# Patient Record
Sex: Male | Born: 1975 | Race: White | Hispanic: No | Smoking: Current every day smoker
Health system: Southern US, Community
[De-identification: ages and names within clinical notes are randomized; demographics above are authoritative.]

## PROBLEM LIST (undated history)

## (undated) DIAGNOSIS — I10 Essential (primary) hypertension: Secondary | ICD-10-CM

## (undated) DIAGNOSIS — E079 Disorder of thyroid, unspecified: Secondary | ICD-10-CM

---

## 2002-08-30 HISTORY — PX: CHOLECYSTECTOMY: SHX55

## 2012-08-30 HISTORY — PX: SHOULDER ARTHROSCOPY WITH ROTATOR CUFF REPAIR: SHX5685

## 2013-04-11 ENCOUNTER — Emergency Department: Payer: Self-pay | Admitting: Emergency Medicine

## 2013-04-19 ENCOUNTER — Encounter (HOSPITAL_COMMUNITY): Payer: Self-pay | Admitting: Emergency Medicine

## 2013-04-19 ENCOUNTER — Emergency Department (HOSPITAL_COMMUNITY): Payer: Self-pay

## 2013-04-19 ENCOUNTER — Emergency Department (HOSPITAL_COMMUNITY)
Admission: EM | Admit: 2013-04-19 | Discharge: 2013-04-20 | Disposition: A | Discharge status: Home / Self Care | Payer: Self-pay | Attending: Emergency Medicine | Admitting: Emergency Medicine

## 2013-04-19 DIAGNOSIS — Y9289 Other specified places as the place of occurrence of the external cause: Secondary | ICD-10-CM | POA: Insufficient documentation

## 2013-04-19 DIAGNOSIS — S43401A Unspecified sprain of right shoulder joint, initial encounter: Secondary | ICD-10-CM

## 2013-04-19 DIAGNOSIS — IMO0002 Reserved for concepts with insufficient information to code with codable children: Secondary | ICD-10-CM | POA: Insufficient documentation

## 2013-04-19 DIAGNOSIS — Y9301 Activity, walking, marching and hiking: Secondary | ICD-10-CM | POA: Insufficient documentation

## 2013-04-19 DIAGNOSIS — W010XXA Fall on same level from slipping, tripping and stumbling without subsequent striking against object, initial encounter: Secondary | ICD-10-CM | POA: Insufficient documentation

## 2013-04-19 DIAGNOSIS — I10 Essential (primary) hypertension: Secondary | ICD-10-CM | POA: Insufficient documentation

## 2013-04-19 HISTORY — DX: Disorder of thyroid, unspecified: E07.9

## 2013-04-19 HISTORY — DX: Essential (primary) hypertension: I10

## 2013-04-19 MED ORDER — LISINOPRIL-HYDROCHLOROTHIAZIDE 10-12.5 MG PO TABS: 1.0000 | ORAL_TABLET | Freq: Every day | ORAL | Status: DC

## 2013-04-19 MED ORDER — TRAMADOL HCL 50 MG PO TABS: 50.0000 mg | ORAL_TABLET | Freq: Four times a day (QID) | ORAL | Status: DC | PRN

## 2013-04-19 NOTE — ED Notes (Signed)
Patient said he slipped while walking.  He said the wood ramp was slippery and he fell on his right shoulder.  Patient said he was seen at [facility] and they did x-rays however they never said what the results of the x-rays because the radiologist was not available to read the x-rays.  They discharged him with pain meds and told him it might just be a sprain.  They told him if it did not get better to come back to the ED in [place] and be seen.  The patient said his pain has gotten worse, his elbow hurts, and his right hand gets swollen.

## 2013-04-19 NOTE — ED Notes (Signed)
Family at bedside. 

## 2013-04-19 NOTE — ED Provider Notes (Signed)
CSN: [id number]     Arrival date & time 04/19/13  1213 History     First MD Initiated Contact with Patient 04/19/13 1216     Chief Complaint  Patient presents with  . Shoulder Pain   (Consider location/radiation/quality/duration/timing/severity/associated sxs/prior Treatment) HPI Patient is a 37 year old male who presents to emergency department complaining of right shoulder pain and injury. Patient states he was walking on a wet ground, states he slipped and landed onto the right shoulder. Patient did not sustain any other injuries at that time. Patient states that he went to [facility] and he had an x-ray taken of his shoulder but it was never told what the results were. Patient was discharged with Naprosyn. Patient states he has not followed up because he does not have any insurance and does not have a primary care doctor. Patient states he took his Naprosyn and Norco which he was prescribed. Patient states that his arm is not feeling any better. Denies any numbness or weakness in his hand. Patient has never injured that arm in the past. Patient states pain is worsened with movement of the shoulder. Nothing makes the pain better. Patient states he feels clicking and popping inside his right shoulder joint. No past medical history on file. No past surgical history on file. No family history on file. History  Substance Use Topics  . Smoking status: Not on file  . Smokeless tobacco: Not on file  . Alcohol Use: Not on file    Review of Systems  Constitutional: Negative for fever and chills.  HENT: Negative for neck pain and neck stiffness.   Musculoskeletal: Positive for myalgias, joint swelling and arthralgias.  Skin: Negative for rash.  Allergic/Immunologic: Negative for immunocompromised state.  Neurological: Negative for dizziness, weakness, light-headedness, numbness and headaches.  All other systems reviewed and are negative.    Allergies  Review of patient's  allergies indicates not on file.  Home Medications  No current outpatient prescriptions on file. BP 179/113  Pulse 83  Temp(Src) 97.7 F (36.5 C) (Oral)  Resp 18  SpO2 97% Physical Exam  Nursing note and vitals reviewed. Constitutional: He appears well-developed and well-nourished. No distress.  HENT:  Head: Normocephalic and atraumatic.  Eyes: Conjunctivae are normal.  Neck: Neck supple.  Cardiovascular: Normal rate, regular rhythm and normal heart sounds.   Distal radial pulses intact bilaterally.  Pulmonary/Chest: Effort normal. No respiratory distress. He has no wheezes. He has no rales.  Abdominal: Soft. Bowel sounds are normal. He exhibits no distension. There is no tenderness. There is no rebound.  Musculoskeletal: He exhibits no edema.  Bruising noted to the right upper arm over his biceps muscle. Bicep appears normal compared to the left. Patient has 5 out of 5 and equal biceps, triceps, deltoid strength bilaterally. Patient does not have any joint tenderness over the right shoulder. Patient has full passive range of motion of the right shoulder. Patient unable to lift right arm above 90 actively. Patient has pain with internal rotation, normal external rotation of the joint. Negative arm drop test.  Neurological: He is alert.  Skin: Skin is warm and dry.    ED Course   Procedures (including critical care time)  Labs Reviewed - No data to display No results found.  1. Shoulder sprain, right, initial encounter   2. Hypertension     MDM  Patient with right shoulder injury one week ago. He does not have a primary care doctor or an orthopedics doctor in insurance.  X-ray today is normal. It is possible that patient has an injury to his rotator cuff versus labrum. He does have a sling. Encouraged to ice, NSAIDs for pain and inflammation, Ultram for severe pain. He is given resources for primary care doctor here in this area. He is encouraged to follow up. As far as his  blood pressure have prescribed him lisinopril-hydrochlorothiazide for his blood pressure with 2 refills until he is able to get in with a primary care doctor for recheck. Patient is a asymptomatic for his high blood pressure.  Filed Vitals:   04/19/13 1220 04/19/13 1230 04/19/13 1245  BP: 179/113 186/115 167/112  Pulse: 83 82 77  Temp: 97.7 F (36.5 C)    TempSrc: Oral    Resp: 18    SpO2: 97% 98% 96%     Lottie Mussel, PA-C 04/20/13 [phone]

## 2013-04-19 NOTE — ED Notes (Signed)
Patient transported to X-ray 

## 2013-04-20 NOTE — ED Provider Notes (Signed)
Medical screening examination/treatment/procedure(s) were performed by non-physician practitioner and as supervising physician I was immediately available for consultation/collaboration.   Kathren Scearce, MD 04/20/13 1017 

## 2013-04-25 ENCOUNTER — Encounter: Payer: Self-pay | Admitting: Internal Medicine

## 2013-04-25 ENCOUNTER — Ambulatory Visit: Payer: Self-pay | Attending: Internal Medicine | Admitting: Internal Medicine

## 2013-04-25 VITALS — BP 169/135 | HR 86 | Temp 98.6°F | Resp 16 | Ht 73.0 in | Wt 217.0 lb

## 2013-04-25 DIAGNOSIS — S46001S Unspecified injury of muscle(s) and tendon(s) of the rotator cuff of right shoulder, sequela: Secondary | ICD-10-CM

## 2013-04-25 DIAGNOSIS — M25519 Pain in unspecified shoulder: Secondary | ICD-10-CM | POA: Insufficient documentation

## 2013-04-25 DIAGNOSIS — S46009A Unspecified injury of muscle(s) and tendon(s) of the rotator cuff of unspecified shoulder, initial encounter: Secondary | ICD-10-CM | POA: Insufficient documentation

## 2013-04-25 DIAGNOSIS — IMO0001 Reserved for inherently not codable concepts without codable children: Secondary | ICD-10-CM

## 2013-04-25 MED ORDER — OXYCODONE-ACETAMINOPHEN 5-325 MG PO TABS: 1.0000 | ORAL_TABLET | ORAL | Status: DC | PRN

## 2013-04-25 NOTE — Progress Notes (Signed)
Patient ID: Donald Middleton, male   DOB: 1976-06-01, 37 y.o.   MRN: [id number]   CC: Pain in the right shoulder  HPI: Patient is 37 year old male who presents to clinic with main concern of progressively worsening right shoulder pain, intermittent in nature and throbbing, 5/10 in severity when present but gets up to 10 out of 10 in severity on certain occasions. Patient explains he fell on that side 2 weeks prior to this visit and has not been getting significantly better. He denies any specific alleviating factors, pain is worse with movement. Patient denies similar events in the past. Patient denies numbness and tingling, no fevers and chills, no other systemic symptoms.  No Known Allergies Past Medical History  Diagnosis Date  . Hypertension   . Thyroid disease    Current Outpatient Prescriptions on File Prior to Visit  Medication Sig Dispense Refill  . traMADol (ULTRAM) 50 MG tablet Take 1 tablet (50 mg total) by mouth every 6 (six) hours as needed for pain.  15 tablet  0  . lisinopril-hydrochlorothiazide (PRINZIDE,ZESTORETIC) 10-12.5 MG per tablet Take 1 tablet by mouth daily.  30 tablet  2   No current facility-administered medications on file prior to visit.   Family History  Problem Relation Age of Onset  . Thyroid disease Mother   . Heart disease Father   . Stroke Father    History   Social History  . Marital Status: Single    Spouse Name: N/A    Number of Children: N/A  . Years of Education: N/A   Occupational History  . Not on file.   Social History Main Topics  . Smoking status: Current Every Day Smoker -- 1.00 packs/day    Types: Cigarettes  . Smokeless tobacco: Never Used  . Alcohol Use: Yes     Comment: 6 pack a day  . Drug Use: Yes    Special: Cocaine  . Sexual Activity: Not on file   Other Topics Concern  . Not on file   Social History Narrative  . No narrative on file    Review of Systems  Constitutional: Negative for fever, chills, diaphoresis,  activity change, appetite change and fatigue.  HENT: Negative for ear pain, nosebleeds, congestion, facial swelling, rhinorrhea, neck pain, neck stiffness and ear discharge.   Eyes: Negative for pain, discharge, redness, itching and visual disturbance.  Respiratory: Negative for cough, choking, chest tightness, shortness of breath, wheezing and stridor.   Cardiovascular: Negative for chest pain, palpitations and leg swelling.  Gastrointestinal: Negative for abdominal distention.  Genitourinary: Negative for dysuria, urgency, frequency, hematuria, flank pain, decreased urine volume, difficulty urinating and dyspareunia.  Musculoskeletal: Negative for back pain, and gait problem.  Neurological: Negative for dizziness, tremors, seizures, syncope, facial asymmetry, speech difficulty, weakness, light-headedness, numbness and headaches.  Hematological: Negative for adenopathy. Does not bruise/bleed easily.  Psychiatric/Behavioral: Negative for hallucinations, behavioral problems, confusion, dysphoric mood, decreased concentration and agitation.    Objective:   Filed Vitals:   04/25/13 0944  BP: 169/135  Pulse: 86  Temp: 98.6 F (37 C)  Resp: 16    Physical Exam  Constitutional: Appears well-developed and well-nourished. No distress.  HENT: Normocephalic. External right and left ear normal. Oropharynx is clear and moist.  Eyes: Conjunctivae and EOM are normal. PERRLA, no scleral icterus.  Neck: Normal ROM. Neck supple. No JVD. No tracheal deviation. No thyromegaly.  CVS: RRR, S1/S2 +, no murmurs, no gallops, no carotid bruit.  Pulmonary: Effort and breath sounds normal, no  stridor, rhonchi, wheezes, rales.  Abdominal: Soft. BS +,  no distension, tenderness, rebound or guarding.  Musculoskeletal: Limited range of motion in the right shoulder. Patient unable to extend and flex right shoulder, patient also having difficulty with abduction and adduction of the shoulder  Lymphadenopathy: No  lymphadenopathy noted, cervical, inguinal. Neuro: Alert. Normal reflexes, muscle tone coordination. No cranial nerve deficit. Skin: Skin is warm and dry. No rash noted. Not diaphoretic. No erythema. No pallor.  Psychiatric: Normal mood and affect. Behavior, judgment, thought content normal.   No results found for this basename: WBC, HGB, HCT, MCV, PLT   No results found for this basename: CREATININE, BUN, NA, K, CL, CO2    No results found for this basename: HGBA1C   Lipid Panel  No results found for this basename: chol, trig, hdl, cholhdl, vldl, ldlcalc       Assessment and plan:   Rotator cuff injury - Will prescribe analgesia for symptom control, also advised continuation of physical therapy - I have advised placing warm or cold compresses to the area which ever alleviate symptoms better - I have advised patient to come back in 2-3 weeks if his symptoms do not get better or if he gets worse

## 2013-04-25 NOTE — Patient Instructions (Signed)
Rotator Cuff Injury  The rotator cuff is the collective set of muscles and tendons that make up the stabilizing unit of your shoulder. This unit holds in the ball of the humerus (upper arm bone) in the socket of the scapula (shoulder blade). Injuries to this stabilizing unit most commonly come from sports or activities that cause the arm to be moved repeatedly over the head. Examples of this include throwing, weight lifting, swimming, racquet sports, or an injury such as falling on your arm. Chronic (longstanding) irritation of this unit can cause inflammation (soreness), bursitis, and eventual damage to the tendons to the point of rupture (tear). An acute (sudden) injury of the rotator cuff can result in a partial or complete tear. You may need surgery with complete tears. Small or partial rotator cuff tears may be treated conservatively with temporary immobilization, exercises and rest. Physical therapy may be needed.  HOME CARE INSTRUCTIONS   · Apply ice to the injury for 15-20 minutes 3-4 times per day for the first 2 days. Put the ice in a plastic bag and place a towel between the bag of ice and your skin.  · If you have a shoulder immobilizer (sling and straps), do not remove it for as long as directed by your caregiver or until you see a caregiver for a follow-up examination. If you need to remove it, move your arm as little as possible.  · You may want to sleep on several pillows or in a recliner at night to lessen swelling and pain.  · Only take over-the-counter or prescription medicines for pain, discomfort, or fever as directed by your caregiver.  · Do simple hand squeezing exercises with a soft rubber ball to decrease hand swelling.  SEEK MEDICAL CARE IF:   · Pain in your shoulder increases or new pain or numbness develops in your arm, hand, or fingers.  · Your hand or fingers are colder than your other hand.  SEEK IMMEDIATE MEDICAL CARE IF:   · Your arm, hand, or fingers are numb or tingling.  · Your  arm, hand, or fingers are increasingly swollen and painful, or turn white or blue.  Document Released: 08/13/2000 Document Revised: 11/08/2011 Document Reviewed: 08/06/2008  [employer]® Patient Information ©2014 [employer].

## 2013-04-25 NOTE — Progress Notes (Signed)
PT HERE TO ESTABLISH CARE HTN,THYROID DISEASE. NOT TAKING MEDS AT THIS TIME C/O R SHOULDER PAIN S/P FALL 2 WEEKS AGO. TRAMADOL NOT EFFECTIVE.NEG XRAYS BP 169/135- STATES UNABLE TO GET MEDICINE UNTIL Friday. DENIES H/A OR BLURRY VISION SMOKER

## 2013-05-03 ENCOUNTER — Ambulatory Visit: Payer: Self-pay | Attending: Internal Medicine | Admitting: Internal Medicine

## 2013-05-03 ENCOUNTER — Encounter: Payer: Self-pay | Admitting: Internal Medicine

## 2013-05-03 VITALS — BP 129/91 | HR 91 | Temp 98.0°F | Resp 16 | Ht 72.84 in | Wt 219.0 lb

## 2013-05-03 DIAGNOSIS — S46001S Unspecified injury of muscle(s) and tendon(s) of the rotator cuff of right shoulder, sequela: Secondary | ICD-10-CM

## 2013-05-03 MED ORDER — NAPROXEN 500 MG PO TABS: 500.0000 mg | ORAL_TABLET | Freq: Two times a day (BID) | ORAL | Status: DC

## 2013-05-03 MED ORDER — TRAMADOL HCL 50 MG PO TABS: 50.0000 mg | ORAL_TABLET | Freq: Four times a day (QID) | ORAL | Status: DC | PRN

## 2013-05-03 MED ORDER — LISINOPRIL-HYDROCHLOROTHIAZIDE 10-12.5 MG PO TABS: 1.0000 | ORAL_TABLET | Freq: Every day | ORAL | Status: DC

## 2013-05-03 NOTE — Progress Notes (Unsigned)
Pt is here for a follow up visit Pt was treated last visit for a right shoulder injury. Today his shoulder is better but he feels he is not able to go back to work yet.

## 2013-05-03 NOTE — Progress Notes (Unsigned)
Patient ID: Donald Middleton, male   DOB: 12-08-1975, 37 y.o.   MRN: [id number] Patient Demographics  Donald Middleton, is a 37 y.o. male  WUJ:[id number]  NFA:[id number]  DOB - 09/23/1975  Chief Complaint  Patient presents with  . Follow-up        Subjective:   Donald Middleton today is here for a follow up visit. Patient has No headache, No chest pain, No abdominal pain - No Nausea, No new weakness tingling or numbness, No Cough - SOB.  Continues to have right shoulder pain, unable to abduct or lift up. Works in [employer] loading trucks. Had a fall 2 weeks ago when he fell from the wheelchair ramp.  Objective:    Filed Vitals:   05/03/13 1706  BP: 129/91  Pulse: 91  Temp: 98 F (36.7 C)  TempSrc: Oral  Resp: 16  Height: 6' 0.83" (1.85 m)  Weight: 219 lb (99.338 kg)  SpO2: 98%     ALLERGIES:  No Known Allergies  PAST MEDICAL HISTORY: Past Medical History  Diagnosis Date  . Hypertension   . Thyroid disease     MEDICATIONS AT HOME: Prior to Admission medications   Medication Sig Start Date End Date Taking? Authorizing Provider  lisinopril-hydrochlorothiazide (PRINZIDE,ZESTORETIC) 10-12.5 MG per tablet Take 1 tablet by mouth daily. 05/03/13  Yes Ripudeep Jenna Luo, MD  oxyCODONE-acetaminophen (PERCOCET/ROXICET) 5-325 MG per tablet Take 1 tablet by mouth every 4 (four) hours as needed for pain. 04/25/13  Yes Dorothea Ogle, MD  naproxen (NAPROSYN) 500 MG tablet Take 1 tablet (500 mg total) by mouth 2 (two) times daily with a meal. 05/03/13   Ripudeep Jenna Luo, MD  traMADol (ULTRAM) 50 MG tablet Take 1 tablet (50 mg total) by mouth every 6 (six) hours as needed for pain. 05/03/13   Ripudeep Jenna Luo, MD     Exam  General appearance :Awake, alert, NAD, Speech Clear.  HEENT: Atraumatic and Normocephalic, PERLA Neck: supple, no JVD. No cervical lymphadenopathy.  Chest: Clear to auscultation bilaterally, no wheezing, rales or rhonchi CVS: S1 S2 regular, no murmurs.  Abdomen: soft, NBS,  NT, ND, no gaurding, rigidity or rebound. Extremities: no cyanosis or clubbing, B/L Lower Ext shows no edema Unable to abduct or lift up the arm above the shoulder level- right Neurology: Awake alert, and oriented X 3, CN II-XII intact, Non focal Skin: No Rash or lesions Wounds:N/A    Data Review   Basic Metabolic Panel: No results found for this basename: NA, K, CL, CO2, GLUCOSE, BUN, CREATININE, CALCIUM, MG, PHOS,  in the last 168 hours Liver Function Tests: No results found for this basename: AST, ALT, ALKPHOS, BILITOT, PROT, ALBUMIN,  in the last 168 hours  CBC: No results found for this basename: WBC, NEUTROABS, HGB, HCT, MCV, PLT,  in the last 168 hours  ------------------------------------------------------------------------------------------------------------------ No results found for this basename: HGBA1C,  in the last 72 hours ------------------------------------------------------------------------------------------------------------------ No results found for this basename: CHOL, HDL, LDLCALC, TRIG, CHOLHDL, LDLDIRECT,  in the last 72 hours ------------------------------------------------------------------------------------------------------------------ No results found for this basename: TSH, T4TOTAL, FREET3, T3FREE, THYROIDAB,  in the last 72 hours ------------------------------------------------------------------------------------------------------------------ No results found for this basename: VITAMINB12, FOLATE, FERRITIN, TIBC, IRON, RETICCTPCT,  in the last 72 hours  Coagulation profile  No results found for this basename: INR, PROTIME,  in the last 168 hours    Assessment & Plan   Active Problems: Right shoulder pain, suspicion for rotator cuff injury - Will do urgent MRI of the right  shoulder to assess for any partial tear or complete tear - Orthopedics referral sent to sports medicine - Refill tramadol, added Naprosyn  Hypertension: Refilled lisinopril  HCTZ  Follow-up in one week     RAI,RIPUDEEP M.D. 05/03/2013, 5:36 PM

## 2013-05-07 ENCOUNTER — Other Ambulatory Visit: Payer: Self-pay | Admitting: Internal Medicine

## 2013-05-07 ENCOUNTER — Ambulatory Visit: Payer: Self-pay | Attending: Family Medicine

## 2013-05-08 ENCOUNTER — Ambulatory Visit (HOSPITAL_COMMUNITY)
Admission: RE | Admit: 2013-05-08 | Discharge: 2013-05-08 | Disposition: A | Discharge status: Home / Self Care | Payer: Self-pay | Source: Ambulatory Visit | Attending: Internal Medicine | Admitting: Internal Medicine

## 2013-05-08 DIAGNOSIS — W19XXXA Unspecified fall, initial encounter: Secondary | ICD-10-CM | POA: Insufficient documentation

## 2013-05-08 DIAGNOSIS — S46001S Unspecified injury of muscle(s) and tendon(s) of the rotator cuff of right shoulder, sequela: Secondary | ICD-10-CM

## 2013-05-08 DIAGNOSIS — S46819A Strain of other muscles, fascia and tendons at shoulder and upper arm level, unspecified arm, initial encounter: Secondary | ICD-10-CM | POA: Insufficient documentation

## 2013-05-09 ENCOUNTER — Ambulatory Visit (INDEPENDENT_AMBULATORY_CARE_PROVIDER_SITE_OTHER): Payer: Self-pay | Admitting: Family Medicine

## 2013-05-09 ENCOUNTER — Encounter: Payer: Self-pay | Admitting: Family Medicine

## 2013-05-09 VITALS — BP 144/99 | HR 82 | Ht 73.0 in | Wt 220.0 lb

## 2013-05-09 DIAGNOSIS — S4980XA Other specified injuries of shoulder and upper arm, unspecified arm, initial encounter: Secondary | ICD-10-CM

## 2013-05-09 DIAGNOSIS — S43429A Sprain of unspecified rotator cuff capsule, initial encounter: Secondary | ICD-10-CM

## 2013-05-09 DIAGNOSIS — S46001A Unspecified injury of muscle(s) and tendon(s) of the rotator cuff of right shoulder, initial encounter: Secondary | ICD-10-CM

## 2013-05-09 DIAGNOSIS — M75101 Unspecified rotator cuff tear or rupture of right shoulder, not specified as traumatic: Secondary | ICD-10-CM

## 2013-05-09 NOTE — Patient Instructions (Addendum)
You have a partial thickness rotator cuff tear of subscapularis and supraspinatus. Start physical therapy and do home exercises as directed. Out of work unless something available where you don't have to use the right arm. Icing 15 minutes at a time 3-4 times a day as needed. Naproxen 500mg  twice a day with food for pain and inflammation. Tramadol as needed for severe pain (no driving on this). At the first of the month we will put in for an orthopedic referral. Follow up with me in 1 month.

## 2013-05-10 ENCOUNTER — Encounter: Payer: Self-pay | Admitting: Family Medicine

## 2013-05-10 NOTE — Progress Notes (Signed)
Patient ID: Donald Middleton, male   DOB: Mar 03, 1976, 37 y.o.   MRN: [id number]  PCP: Jeanann Lewandowsky, MD  Subjective:   HPI: Patient is a 37 y.o. male here for right shoulder injury.  Patient reports on 8/13 he was on wheelchair ramp at his house when he slipped on wet plank of wood and fell onto right shoulder. Is right handed. Taking tramadol, naproxen. Very limited motion. Followed up with PCP after a week of rest and not improving - had MRI showing high grade partial thickness tear of subscapularis, supraspinatus along with biceps tendon dislocation. Right handed.  Past Medical History  Diagnosis Date  . Hypertension   . Thyroid disease     Current Outpatient Prescriptions on File Prior to Visit  Medication Sig Dispense Refill  . lisinopril-hydrochlorothiazide (PRINZIDE,ZESTORETIC) 10-12.5 MG per tablet Take 1 tablet by mouth daily.  30 tablet  3  . traMADol (ULTRAM) 50 MG tablet Take 1 tablet (50 mg total) by mouth every 6 (six) hours as needed for pain.  60 tablet  0  . naproxen (NAPROSYN) 500 MG tablet Take 1 tablet (500 mg total) by mouth 2 (two) times daily with a meal.  60 tablet  1   No current facility-administered medications on file prior to visit.    Past Surgical History  Procedure Laterality Date  . Cholecystectomy  2004    No Known Allergies  History   Social History  . Marital Status: Single    Spouse Name: N/A    Number of Children: N/A  . Years of Education: N/A   Occupational History  . Not on file.   Social History Main Topics  . Smoking status: Current Every Day Smoker -- 1.00 packs/day    Types: Cigarettes  . Smokeless tobacco: Never Used  . Alcohol Use: Yes     Comment: 6 pack a day  . Drug Use: Yes    Special: Cocaine  . Sexual Activity: Not on file   Other Topics Concern  . Not on file   Social History Narrative  . No narrative on file    Family History  Problem Relation Age of Onset  . Thyroid disease Mother   . Heart  disease Father   . Stroke Father   . Heart attack Father   . Hypertension Father   . Diabetes Neg Hx   . Hyperlipidemia Neg Hx     BP 144/99  Pulse 82  Ht 6\' 1"  (1.854 m)  Wt 220 lb (99.791 kg)  BMI 29.03 kg/m2  Review of Systems: See HPI above.    Objective:  Physical Exam:  Gen: NAD  R shoulder: No swelling, ecchymoses.  No gross deformity. Mild TTP biceps tendon.  No other TTP. Full IR.  ER to 30 degrees, flexion 150 degrees, abduction 90 degrees. Biceps tendon subluxation noted with ER and abduction, passive motion. Positive Hawkins, Neers. Negative Speeds, Yergasons. Pain, 4/5 strength with empty can and IR.  5/5 strength without pain ER. NV intact distally.    Assessment & Plan:  1. Right shoulder pain - 2/2 high grade partial tears of subscapularis and supraspinatus along with biceps tendon dislocation.  He has better motion than I would expect at this point though he is 4 weeks out from the injury.  Discussed his options (PT vs surgery) - given high grade nature of tears he will likely need surgical intervention though with improvement to date, current motion better than expected he may improve with conservative care.  Ortho referral limited due to [facility] status - first available would be starting October 1.  Will start PT in meantime.  Out of work, icing nsaids, tramadol.  F/u in 4 weeks.

## 2013-05-10 NOTE — Assessment & Plan Note (Signed)
2/2 high grade partial tears of subscapularis and supraspinatus along with biceps tendon dislocation.  He has better motion than I would expect at this point though he is 4 weeks out from the injury.  Discussed his options (PT vs surgery) - given high grade nature of tears he will likely need surgical intervention though with improvement to date, current motion better than expected he may improve with conservative care.  Ortho referral limited due to [facility] status - first available would be starting October 1.  Will start PT in meantime.  Out of work, icing nsaids, tramadol.  F/u in 4 weeks.

## 2013-06-06 ENCOUNTER — Ambulatory Visit (INDEPENDENT_AMBULATORY_CARE_PROVIDER_SITE_OTHER): Payer: Self-pay | Admitting: Family Medicine

## 2013-06-06 ENCOUNTER — Encounter: Payer: Self-pay | Admitting: Family Medicine

## 2013-06-06 VITALS — BP 138/97 | HR 93 | Ht 73.0 in | Wt 220.0 lb

## 2013-06-06 DIAGNOSIS — Z5189 Encounter for other specified aftercare: Secondary | ICD-10-CM

## 2013-06-06 DIAGNOSIS — S46001D Unspecified injury of muscle(s) and tendon(s) of the rotator cuff of right shoulder, subsequent encounter: Secondary | ICD-10-CM

## 2013-06-07 ENCOUNTER — Encounter: Payer: Self-pay | Admitting: Family Medicine

## 2013-06-07 NOTE — Progress Notes (Signed)
Patient ID: Donald Middleton, male   DOB: 1976-03-28, 37 y.o.   MRN: [id number] Patient ID: Donald Middleton, male   DOB: [birth year], 37 y.o.   MRN: [id number]  PCP: Jeanann Lewandowsky, MD  Subjective:   HPI: Patient is a 37 y.o. male here for right shoulder injury.  9/10: Patient reports on 8/13 he was on wheelchair ramp at his house when he slipped on wet plank of wood and fell onto right shoulder. Is right handed. Taking tramadol, naproxen. Very limited motion. Followed up with PCP after a week of rest and not improving - had MRI showing high grade partial thickness tear of subscapularis, supraspinatus along with biceps tendon dislocation. Right handed.  10/8: Patient has been doing PT with only mild benefit to date. Doing home exercises as well. Taking naproxen with tramadol as needed. + night pain and early morning pain. Motion with only mild improvement. No new injuries.  Past Medical History  Diagnosis Date  . Hypertension   . Thyroid disease     Current Outpatient Prescriptions on File Prior to Visit  Medication Sig Dispense Refill  . lisinopril-hydrochlorothiazide (PRINZIDE,ZESTORETIC) 10-12.5 MG per tablet Take 1 tablet by mouth daily.  30 tablet  3  . naproxen (NAPROSYN) 500 MG tablet Take 1 tablet (500 mg total) by mouth 2 (two) times daily with a meal.  60 tablet  1  . traMADol (ULTRAM) 50 MG tablet Take 1 tablet (50 mg total) by mouth every 6 (six) hours as needed for pain.  60 tablet  0   No current facility-administered medications on file prior to visit.    Past Surgical History  Procedure Laterality Date  . Cholecystectomy  2004    No Known Allergies  History   Social History  . Marital Status: Single    Spouse Name: N/A    Number of Children: N/A  . Years of Education: N/A   Occupational History  . Not on file.   Social History Main Topics  . Smoking status: Current Every Day Smoker -- 1.00 packs/day    Types: Cigarettes  . Smokeless tobacco: Never  Used  . Alcohol Use: Yes     Comment: 6 pack a day  . Drug Use: Yes    Special: Cocaine  . Sexual Activity: Not on file   Other Topics Concern  . Not on file   Social History Narrative  . No narrative on file    Family History  Problem Relation Age of Onset  . Thyroid disease Mother   . Heart disease Father   . Stroke Father   . Heart attack Father   . Hypertension Father   . Diabetes Neg Hx   . Hyperlipidemia Neg Hx     BP 138/97  Pulse 93  Ht 6\' 1"  (1.854 m)  Wt 220 lb (99.791 kg)  BMI 29.03 kg/m2  Review of Systems: See HPI above.    Objective:  Physical Exam:  Gen: NAD  R shoulder: No swelling, ecchymoses.  No gross deformity. Mild TTP biceps tendon.  No other TTP. Full IR.  ER to 30 degrees, flexion 150 degrees, abduction 100 degrees - pain with all motions. Biceps tendon subluxation noted with ER and abduction, passive motion. Positive Hawkins, Neers. Negative Yergasons. Pain, 5-/5 strength with empty can and IR.  5/5 strength without pain resisted ER. NV intact distally.    Assessment & Plan:  1. Right shoulder pain - 2/2 high grade partial tears of subscapularis and supraspinatus along with biceps  tendon dislocation.  Motion has not improved much since last visit even with PT, HEP.  Strength is some better though.  Only feels about 25% improved however.  As discussed previously, given high grade nature of tears he will likely need surgical intervention.  Ortho referral limited due to [facility] status - since has gone a month and only had very little improvement with conservative measures will try to get him in with ortho to discuss possible arthroscopy, rotator cuff repair.  Remains out of work in meantime.  Icing, nsaids, tramadol.

## 2013-06-07 NOTE — Assessment & Plan Note (Signed)
Shoulder pain 2/2 high grade partial tears of subscapularis and supraspinatus along with biceps tendon dislocation.  Motion has not improved much since last visit even with PT, HEP.  Strength is some better though.  Only feels about 25% improved however.  As discussed previously, given high grade nature of tears he will likely need surgical intervention.  Ortho referral limited due to [facility] status - since has gone a month and only had very little improvement with conservative measures will try to get him in with ortho to discuss possible arthroscopy, rotator cuff repair.  Remains out of work in meantime.  Icing, nsaids, tramadol.

## 2013-07-05 ENCOUNTER — Other Ambulatory Visit: Payer: Self-pay

## 2014-06-24 ENCOUNTER — Emergency Department: Payer: Self-pay | Admitting: Emergency Medicine

## 2014-06-24 LAB — CBC
HCT: 49.4 % (ref 40.0–52.0)
HGB: 16.6 g/dL (ref 13.0–18.0)
MCH: 32.5 pg (ref 26.0–34.0)
MCHC: 33.6 g/dL (ref 32.0–36.0)
MCV: 97 fL (ref 80–100)
PLATELETS: 173 10*3/uL (ref 150–440)
RBC: 5.11 10*6/uL (ref 4.40–5.90)
RDW: 13.3 % (ref 11.5–14.5)
WBC: 7.8 10*3/uL (ref 3.8–10.6)

## 2014-06-24 LAB — BASIC METABOLIC PANEL
Anion Gap: 11 (ref 7–16)
BUN: 9 mg/dL (ref 7–18)
Calcium, Total: 8.9 mg/dL (ref 8.5–10.1)
Chloride: 103 mmol/L (ref 98–107)
Co2: 26 mmol/L (ref 21–32)
Creatinine: 0.85 mg/dL (ref 0.60–1.30)
Glucose: 111 mg/dL — ABNORMAL HIGH (ref 65–99)
OSMOLALITY: 279 (ref 275–301)
POTASSIUM: 3.5 mmol/L (ref 3.5–5.1)
Sodium: 140 mmol/L (ref 136–145)

## 2014-06-24 LAB — TROPONIN I

## 2014-08-20 ENCOUNTER — Encounter: Payer: Self-pay | Admitting: Internal Medicine

## 2014-08-20 ENCOUNTER — Ambulatory Visit (INDEPENDENT_AMBULATORY_CARE_PROVIDER_SITE_OTHER): Payer: BC Managed Care – PPO | Admitting: Internal Medicine

## 2014-08-20 VITALS — BP 150/108 | HR 76 | Temp 98.5°F | Ht 71.75 in | Wt 219.0 lb

## 2014-08-20 DIAGNOSIS — I10 Essential (primary) hypertension: Secondary | ICD-10-CM | POA: Insufficient documentation

## 2014-08-20 DIAGNOSIS — E039 Hypothyroidism, unspecified: Secondary | ICD-10-CM

## 2014-08-20 DIAGNOSIS — Z13220 Encounter for screening for lipoid disorders: Secondary | ICD-10-CM

## 2014-08-20 MED ORDER — LISINOPRIL-HYDROCHLOROTHIAZIDE 10-12.5 MG PO TABS: 1.0000 | ORAL_TABLET | Freq: Every day | ORAL | Status: DC

## 2014-08-20 NOTE — Patient Instructions (Signed)
Hypertension Hypertension, commonly called high blood pressure, is when the force of blood pumping through your arteries is too strong. Your arteries are the blood vessels that carry blood from your heart throughout your body. A blood pressure reading consists of a higher number over a lower number, such as 110/72. The higher number (systolic) is the pressure inside your arteries when your heart pumps. The lower number (diastolic) is the pressure inside your arteries when your heart relaxes. Ideally you want your blood pressure below 120/80. Hypertension forces your heart to work harder to pump blood. Your arteries may become narrow or stiff. Having hypertension puts you at risk for heart disease, stroke, and other problems.  RISK FACTORS Some risk factors for high blood pressure are controllable. Others are not.  Risk factors you cannot control include:   Race. You may be at higher risk if you are African American.  Age. Risk increases with age.  Gender. Men are at higher risk than women before age 45 years. After age 65, women are at higher risk than men. Risk factors you can control include:  Not getting enough exercise or physical activity.  Being overweight.  Getting too much fat, sugar, calories, or salt in your diet.  Drinking too much alcohol. SIGNS AND SYMPTOMS Hypertension does not usually cause signs or symptoms. Extremely high blood pressure (hypertensive crisis) may cause headache, anxiety, shortness of breath, and nosebleed. DIAGNOSIS  To check if you have hypertension, your health care Donald Middleton will measure your blood pressure while you are seated, with your arm held at the level of your heart. It should be measured at least twice using the same arm. Certain conditions can cause a difference in blood pressure between your right and left arms. A blood pressure reading that is higher than normal on one occasion does not mean that you need treatment. If one blood pressure reading  is high, ask your health care Donald Middleton about having it checked again. TREATMENT  Treating high blood pressure includes making lifestyle changes and possibly taking medicine. Living a healthy lifestyle can help lower high blood pressure. You may need to change some of your habits. Lifestyle changes may include:  Following the DASH diet. This diet is high in fruits, vegetables, and whole grains. It is low in salt, red meat, and added sugars.  Getting at least 2 hours of brisk physical activity every week.  Losing weight if necessary.  Not smoking.  Limiting alcoholic beverages.  Learning ways to reduce stress. If lifestyle changes are not enough to get your blood pressure under control, your health care Donald Middleton may prescribe medicine. You may need to take more than one. Work closely with your health care Donald Middleton to understand the risks and benefits. HOME CARE INSTRUCTIONS  Have your blood pressure rechecked as directed by your health care Donald Middleton.   Take medicines only as directed by your health care Donald Middleton. Follow the directions carefully. Blood pressure medicines must be taken as prescribed. The medicine does not work as well when you skip doses. Skipping doses also puts you at risk for problems.   Do not smoke.   Monitor your blood pressure at home as directed by your health care Donald Middleton. SEEK MEDICAL CARE IF:   You think you are having a reaction to medicines taken.  You have recurrent headaches or feel dizzy.  You have swelling in your ankles.  You have trouble with your vision. SEEK IMMEDIATE MEDICAL CARE IF:  You develop a severe headache or confusion.    You have unusual weakness, numbness, or feel faint.  You have severe chest or abdominal pain.  You vomit repeatedly.  You have trouble breathing. MAKE SURE YOU:   Understand these instructions.  Will watch your condition.  Will get help right away if you are not doing well or get worse. Document  Released: 08/16/2005 Document Revised: 12/31/2013 Document Reviewed: 06/08/2013 Exit[employer] Patient Information 2015 [employer]. This information is not intended to replace advice given to you by your health care Donald Middleton. Make sure you discuss any questions you have with your health care Donald Middleton.  

## 2014-08-20 NOTE — Progress Notes (Signed)
HPI  Pt presents to the clinic today to establish care. He has not had a PCP in many years.  Flu: never Tetanus: unsure of last one Dentist: as needed  HTN: He does have high blood pressure. He has been on medication in the past. He keeps running out. He denies dizziness, chest pain, or shortness of breath.  Thyroid: He reports he has been on thyroid medication in the past. He ran out and never went back to follow up.  Past Medical History  Diagnosis Date  . Hypertension   . Thyroid disease     Current Outpatient Prescriptions  Medication Sig Dispense Refill  . lisinopril-hydrochlorothiazide (PRINZIDE,ZESTORETIC) 10-12.5 MG per tablet Take 1 tablet by mouth daily. (Patient not taking: Reported on 08/20/2014) 30 tablet 3   No current facility-administered medications for this visit.    No Known Allergies  Family History  Problem Relation Age of Onset  . Thyroid disease Mother   . Heart disease Father   . Heart attack Father   . Hypertension Father   . Diabetes Father   . Hyperlipidemia Neg Hx   . Stroke Paternal Grandmother   . Stroke Paternal Grandfather     History   Social History  . Marital Status: Married    Spouse Name: N/A    Number of Children: N/A  . Years of Education: N/A   Occupational History  . Not on file.   Social History Main Topics  . Smoking status: Current Every Day Smoker -- 1.00 packs/day    Types: Cigarettes  . Smokeless tobacco: Never Used  . Alcohol Use: 0.0 oz/week    0 Not specified per week     Comment: 6 pack a day  . Drug Use: No     Comment: "not lately"  . Sexual Activity:    Partners: Male   Other Topics Concern  . Not on file   Social History Narrative    ROS:  Constitutional: Denies fever, malaise, fatigue, headache or abrupt weight changes.  HEENT: Denies eye pain, eye redness, ear pain, ringing in the ears, wax buildup, runny nose, nasal congestion, bloody nose, or sore throat. Respiratory: Denies difficulty  breathing, shortness of breath, cough or sputum production.   Cardiovascular: Denies chest pain, chest tightness, palpitations or swelling in the hands or feet.  Neurological: Denies dizziness, difficulty with memory, difficulty with speech or problems with balance and coordination.   No other specific complaints in a complete review of systems (except as listed in HPI above).  PE:  BP 150/108 mmHg  Pulse 76  Temp(Src) 98.5 F (36.9 C) (Oral)  Ht 5' 11.75" (1.822 m)  Wt 219 lb (99.338 kg)  BMI 29.92 kg/m2  SpO2 98% Wt Readings from Last 3 Encounters:  08/20/14 219 lb (99.338 kg)  06/06/13 220 lb (99.791 kg)  05/09/13 220 lb (99.791 kg)    General: Appears his stated age, well developed, well nourished in NAD. HEENT: Head: normal shape and size; Eyes: sclera white, no icterus, conjunctiva pink, PERRLA and EOMs intact; Ears: Tm's gray and intact, normal light reflex; Nose: mucosa pink and moist, septum midline; Throat/Mouth: Teeth present, mucosa pink and moist, no lesions or ulcerations noted.  Neck: Neck supple, trachea midline. No masses, lumps or thyromegaly present.  Cardiovascular: Normal rate and rhythm. S1,S2 noted.  No murmur, rubs or gallops noted.  No carotid bruits noted. Pulmonary/Chest: Normal effort and positive vesicular breath sounds. No respiratory distress. No wheezes, rales or ronchi noted.  Neurological:  Alert and oriented.  Psychiatric: Mood and affect normal. Behavior is normal. Judgment and thought content normal.    Assessment and Plan:  Health maintenance:  Encouraged him to stop smoking and drinking Will check lipid profile today He declines flu and tetanus booster today  RTC in 1 month to recheck BP

## 2014-08-20 NOTE — Progress Notes (Signed)
Pre visit review using our clinic review tool, if applicable. No additional management support is needed unless otherwise documented below in the visit note. 

## 2014-08-20 NOTE — Assessment & Plan Note (Signed)
Elevaated today Will restart Lisinopril-HCT Will check CBC and CMET today Encouraged him to quit smoking- he declines

## 2014-08-20 NOTE — Assessment & Plan Note (Signed)
Will check TSH and Free t4 Will restart synthroid if needed

## 2014-08-21 ENCOUNTER — Telehealth: Payer: Self-pay | Admitting: Internal Medicine

## 2014-08-21 LAB — CBC
HCT: 48.1 % (ref 39.0–52.0)
Hemoglobin: 16.5 g/dL (ref 13.0–17.0)
MCHC: 34.2 g/dL (ref 30.0–36.0)
MCV: 93.9 fl (ref 78.0–100.0)
Platelets: 207 10*3/uL (ref 150.0–400.0)
RBC: 5.12 Mil/uL (ref 4.22–5.81)
RDW: 12.6 % (ref 11.5–15.5)
WBC: 8.9 10*3/uL (ref 4.0–10.5)

## 2014-08-21 LAB — COMPREHENSIVE METABOLIC PANEL
ALT: 74 U/L — AB (ref 0–53)
AST: 58 U/L — AB (ref 0–37)
Albumin: 4.8 g/dL (ref 3.5–5.2)
Alkaline Phosphatase: 64 U/L (ref 39–117)
BUN: 17 mg/dL (ref 6–23)
CALCIUM: 9.2 mg/dL (ref 8.4–10.5)
CHLORIDE: 101 meq/L (ref 96–112)
CO2: 26 meq/L (ref 19–32)
CREATININE: 1 mg/dL (ref 0.4–1.5)
GFR: 88.63 mL/min (ref >60.00)
Glucose, Bld: 96 mg/dL (ref 70–99)
POTASSIUM: 4.1 meq/L (ref 3.5–5.1)
SODIUM: 136 meq/L (ref 135–145)
TOTAL PROTEIN: 7.8 g/dL (ref 6.0–8.3)
Total Bilirubin: 1.3 mg/dL — ABNORMAL HIGH (ref 0.2–1.2)

## 2014-08-21 LAB — LIPID PANEL
CHOL/HDL RATIO: 3
CHOLESTEROL: 174 mg/dL (ref 0–200)
HDL: 58.9 mg/dL (ref >39.00)
NONHDL: 115.1
Triglycerides: 239 mg/dL — ABNORMAL HIGH (ref 0.0–149.0)
VLDL: 47.8 mg/dL — AB (ref 0.0–40.0)

## 2014-08-21 LAB — T4, FREE: Free T4: 0.61 ng/dL (ref 0.60–1.60)

## 2014-08-21 LAB — LDL CHOLESTEROL, DIRECT: Direct LDL: 76.9 mg/dL

## 2014-08-21 LAB — TSH: TSH: 3.36 u[IU]/mL (ref 0.35–4.50)

## 2014-08-21 NOTE — Telephone Encounter (Signed)
emmi emailed °

## 2014-09-27 ENCOUNTER — Ambulatory Visit: Payer: BC Managed Care – PPO | Admitting: Internal Medicine

## 2014-10-11 ENCOUNTER — Encounter: Payer: Self-pay | Admitting: Internal Medicine

## 2014-10-11 ENCOUNTER — Ambulatory Visit (INDEPENDENT_AMBULATORY_CARE_PROVIDER_SITE_OTHER): Payer: BLUE CROSS/BLUE SHIELD | Admitting: Internal Medicine

## 2014-10-11 ENCOUNTER — Other Ambulatory Visit: Payer: Self-pay | Admitting: Internal Medicine

## 2014-10-11 VITALS — BP 130/86 | HR 91 | Temp 98.7°F | Wt 222.0 lb

## 2014-10-11 DIAGNOSIS — I10 Essential (primary) hypertension: Secondary | ICD-10-CM

## 2014-10-11 DIAGNOSIS — N529 Male erectile dysfunction, unspecified: Secondary | ICD-10-CM | POA: Insufficient documentation

## 2014-10-11 DIAGNOSIS — R748 Abnormal levels of other serum enzymes: Secondary | ICD-10-CM

## 2014-10-11 LAB — COMPREHENSIVE METABOLIC PANEL
ALBUMIN: 4.8 g/dL (ref 3.5–5.2)
ALT: 78 U/L — ABNORMAL HIGH (ref 0–53)
AST: 59 U/L — ABNORMAL HIGH (ref 0–37)
Alkaline Phosphatase: 65 U/L (ref 39–117)
BUN: 15 mg/dL (ref 6–23)
CHLORIDE: 101 meq/L (ref 96–112)
CO2: 31 meq/L (ref 19–32)
CREATININE: 0.82 mg/dL (ref 0.40–1.50)
Calcium: 9.7 mg/dL (ref 8.4–10.5)
GFR: 111.36 mL/min (ref >60.00)
GLUCOSE: 139 mg/dL — AB (ref 70–99)
Potassium: 3.6 mEq/L (ref 3.5–5.1)
Sodium: 138 mEq/L (ref 135–145)
Total Bilirubin: 1.3 mg/dL — ABNORMAL HIGH (ref 0.2–1.2)
Total Protein: 7.8 g/dL (ref 6.0–8.3)

## 2014-10-11 MED ORDER — LISINOPRIL-HYDROCHLOROTHIAZIDE 10-12.5 MG PO TABS: 1.0000 | ORAL_TABLET | Freq: Every day | ORAL | Status: DC

## 2014-10-11 MED ORDER — TADALAFIL 5 MG PO TABS: 5.0000 mg | ORAL_TABLET | Freq: Every day | ORAL | Status: DC | PRN

## 2014-10-11 NOTE — Progress Notes (Signed)
Subjective:    Patient ID: Donald Middleton, male    DOB: [birth year], 39 y.o.   MRN: [id number]  HPI  Pt presents to the clinic today for 1 month follow up of HTN. He was restarted on his Lisinopril-HCT for a BP of 150/108. He has been taking the medication as prescribed. He has not noticed any adverse effects. He does continue to smoke daily. He has gained 3 lbs in the last month. BP today 130/86.  Additionally, his labs showed elevated liver enzymes. He is a heavy drinker. I had advised him that we should get a hepatitis panel and liver ultrasound. He never responded to my email.   He also reports that he is having a problem maintaining an erection. He has had issues with this in the past. He did attribute it to his blood pressure medication but did not notice any really improvement when he stopped the medication. He has no problem obtaining an erection but reports maintaining is the issue. He also reports his erection does not feel as full as before. He has never taken any medication for this but is interested in trying some sort of medication today.  Review of Systems      Past Medical History  Diagnosis Date  . Hypertension   . Thyroid disease     Current Outpatient Prescriptions  Medication Sig Dispense Refill  . lisinopril-hydrochlorothiazide (PRINZIDE,ZESTORETIC) 10-12.5 MG per tablet Take 1 tablet by mouth daily. 30 tablet 1   No current facility-administered medications for this visit.    No Known Allergies  Family History  Problem Relation Age of Onset  . Thyroid disease Mother   . Heart disease Father   . Heart attack Father   . Hypertension Father   . Diabetes Father   . Hyperlipidemia Neg Hx   . Cancer Neg Hx   . Stroke Paternal Grandmother   . Stroke Paternal Grandfather     History   Social History  . Marital Status: Married    Spouse Name: N/A  . Number of Children: N/A  . Years of Education: N/A   Occupational History  . Not on file.   Social  History Main Topics  . Smoking status: Current Every Day Smoker -- 1.00 packs/day for 15 years    Types: Cigarettes  . Smokeless tobacco: Never Used  . Alcohol Use: 0.0 oz/week    0 Standard drinks or equivalent per week     Comment: 6 pack a day  . Drug Use: No     Comment: "not lately"  . Sexual Activity:    Partners: Male   Other Topics Concern  . Not on file   Social History Narrative     Constitutional: Denies fever, malaise, fatigue, headache or abrupt weight changes.  Respiratory: Denies difficulty breathing, shortness of breath, cough or sputum production.   Cardiovascular: Denies chest pain, chest tightness, palpitations or swelling in the hands or feet.  Gastrointestinal: Denies abdominal pain, bloating, constipation, diarrhea or blood in the stool.  GU: Pt reports erectile dysfunction. Denies urgency, frequency, pain with urination, burning sensation, blood in urine, odor or discharge. Skin: Denies redness, rashes, lesions or ulcercations.  Neurological: Denies dizziness, difficulty with memory, difficulty with speech or problems with balance and coordination.   No other specific complaints in a complete review of systems (except as listed in HPI above).  Objective:   Physical Exam  BP 130/86 mmHg  Pulse 91  Temp(Src) 98.7 F (37.1 C) (Oral)  Wt  222 lb (100.699 kg)  SpO2 98% Wt Readings from Last 3 Encounters:  10/11/14 222 lb (100.699 kg)  08/20/14 219 lb (99.338 kg)  06/06/13 220 lb (99.791 kg)    General: Appears his stated age, well developed, well nourished in NAD. Skin: Warm, dry and intact. No rashes, lesions or ulcerations noted. HEENT: Head: normal shape and size; Eyes: sclera white, no icterus, conjunctiva pink, PERRLA and EOMs intact. Cardiovascular: Normal rate and rhythm. S1,S2 noted.  No murmur, rubs or gallops noted. No JVD or BLE edema. No carotid bruits noted. Pulmonary/Chest: Normal effort and positive vesicular breath sounds. No  respiratory distress. No wheezes, rales or ronchi noted.  Abdomen: Soft and nontender. Normal bowel sounds, no bruits noted. No distention or masses noted. Liver, spleen and kidneys non palpable. Neurological: Alert and oriented. Coordination normal.    BMET    Component Value Date/Time   NA 136 08/20/2014 1533   K 4.1 08/20/2014 1533   CL 101 08/20/2014 1533   CO2 26 08/20/2014 1533   GLUCOSE 96 08/20/2014 1533   BUN 17 08/20/2014 1533   CREATININE 1.0 08/20/2014 1533   CALCIUM 9.2 08/20/2014 1533    Lipid Panel     Component Value Date/Time   CHOL 174 08/20/2014 1533   TRIG 239.0* 08/20/2014 1533   HDL 58.90 08/20/2014 1533   CHOLHDL 3 08/20/2014 1533   VLDL 47.8* 08/20/2014 1533    CBC    Component Value Date/Time   WBC 8.9 08/20/2014 1533   RBC 5.12 08/20/2014 1533   HGB 16.5 08/20/2014 1533   HCT 48.1 08/20/2014 1533   PLT 207.0 08/20/2014 1533   MCV 93.9 08/20/2014 1533   MCHC 34.2 08/20/2014 1533   RDW 12.6 08/20/2014 1533    Hgb A1C No results found for: HGBA1C       Assessment & Plan:   Elevated liver enzymes:  Will repeat CMET today Will check Hepatitis panel If liver enzymes remain elevated, would get abdominal ultrasound  RTC in 6 months or sooner if needed

## 2014-10-11 NOTE — Assessment & Plan Note (Signed)
Savings card given for Cialis Will try daily dosing for Cialis If med too expensive, he will call me and we can figure out an alternative

## 2014-10-11 NOTE — Progress Notes (Signed)
Pre visit review using our clinic review tool, if applicable. No additional management support is needed unless otherwise documented below in the visit note. 

## 2014-10-11 NOTE — Patient Instructions (Signed)

## 2014-10-11 NOTE — Assessment & Plan Note (Signed)
Improved on Lisinopril HCT Will check kidney function and potassium today

## 2014-10-12 LAB — HEPATITIS A ANTIBODY, IGM: HEP A IGM: NONREACTIVE

## 2014-10-12 LAB — HEPATITIS B SURFACE ANTIBODY, QUANTITATIVE: HEPATITIS B-POST: 0.3 m[IU]/mL

## 2014-10-12 LAB — HEPATITIS C ANTIBODY: HCV AB: NEGATIVE

## 2014-10-18 ENCOUNTER — Telehealth: Payer: Self-pay

## 2014-10-18 NOTE — Telephone Encounter (Signed)
Pt left v/m returning call;Unable to reach pt by phone and left detailed message to call office back and if knows who left message would be helpful.

## 2015-01-14 IMAGING — CR DG SHOULDER 3+V*R*
1 series · 3 of 3 positions shown · non-contrast
Comparison: None

REASON FOR EXAM: fall, painful, unable to lift
COMMENTS:

PROCEDURE:     DXR - DXR SHOULDER RIGHT COMPLETE  - April 11, 2013  [DATE]
RESULT:     History: Fall

[Series 1: w shoulder external right · 0.14mm/px · 3 of 3 slices shown]
[im 1/3]
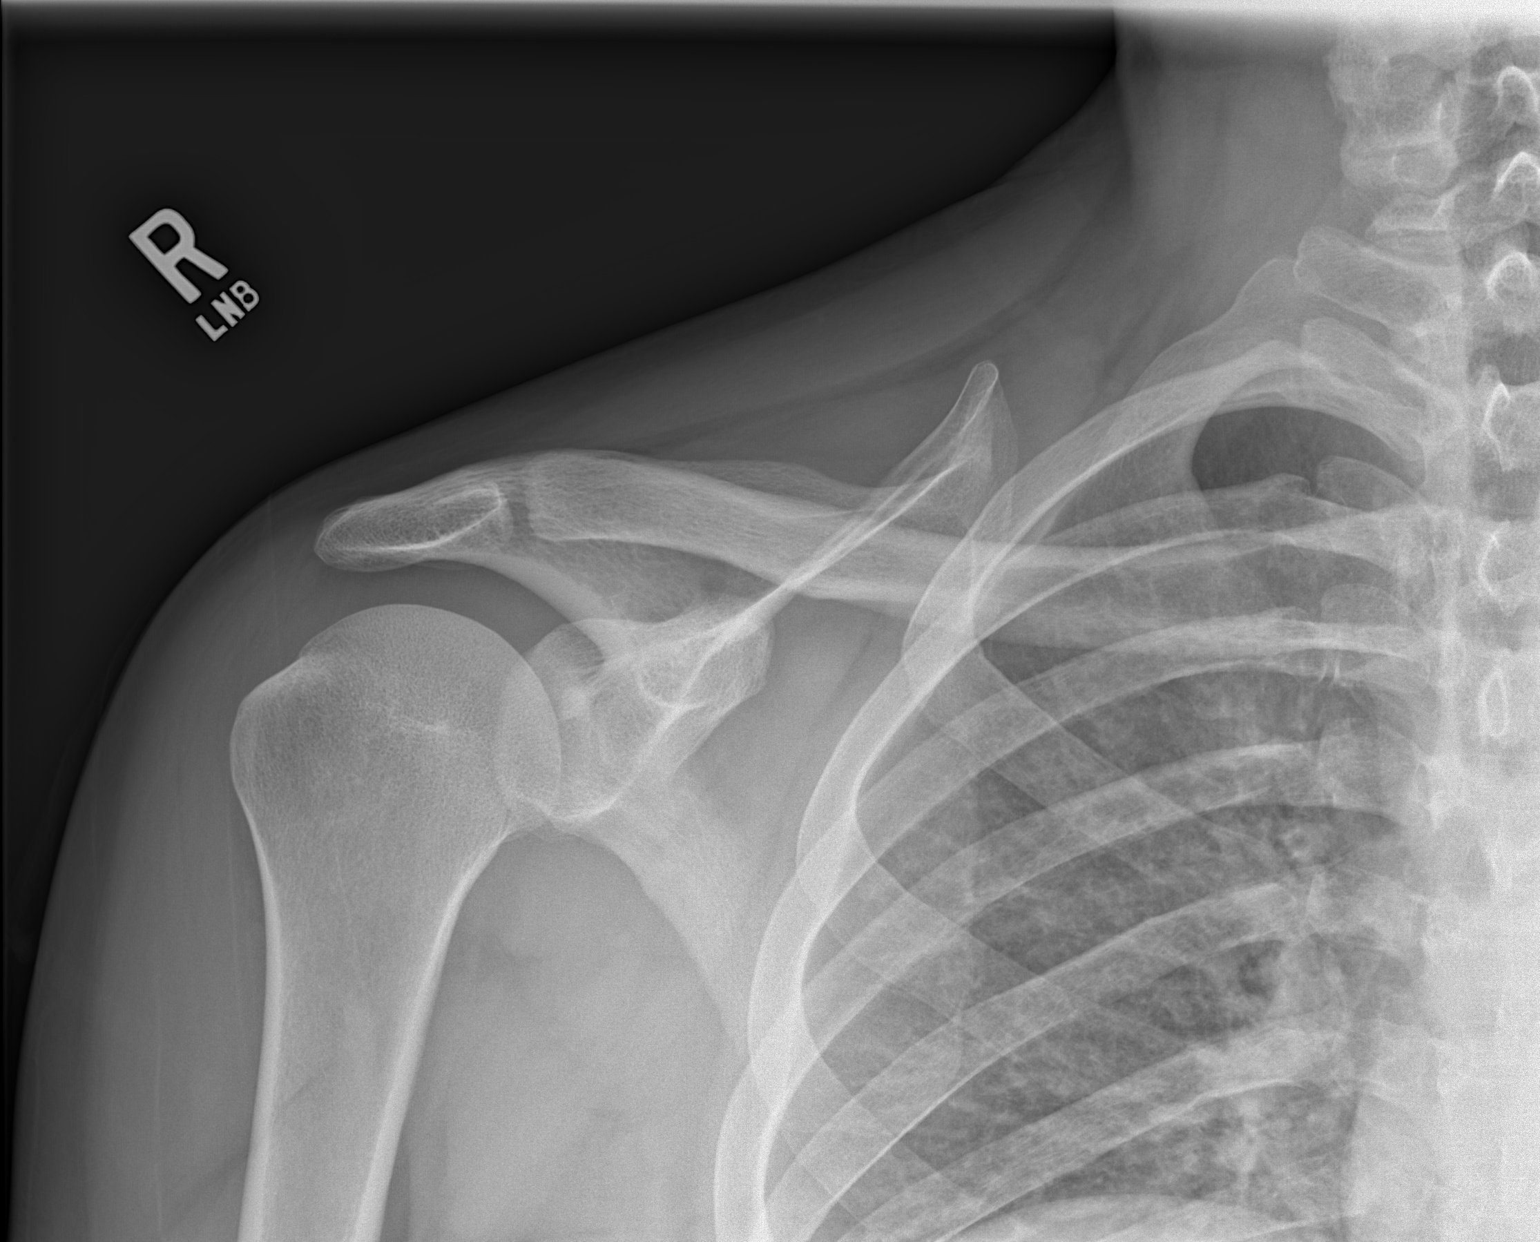
[im 2/3]
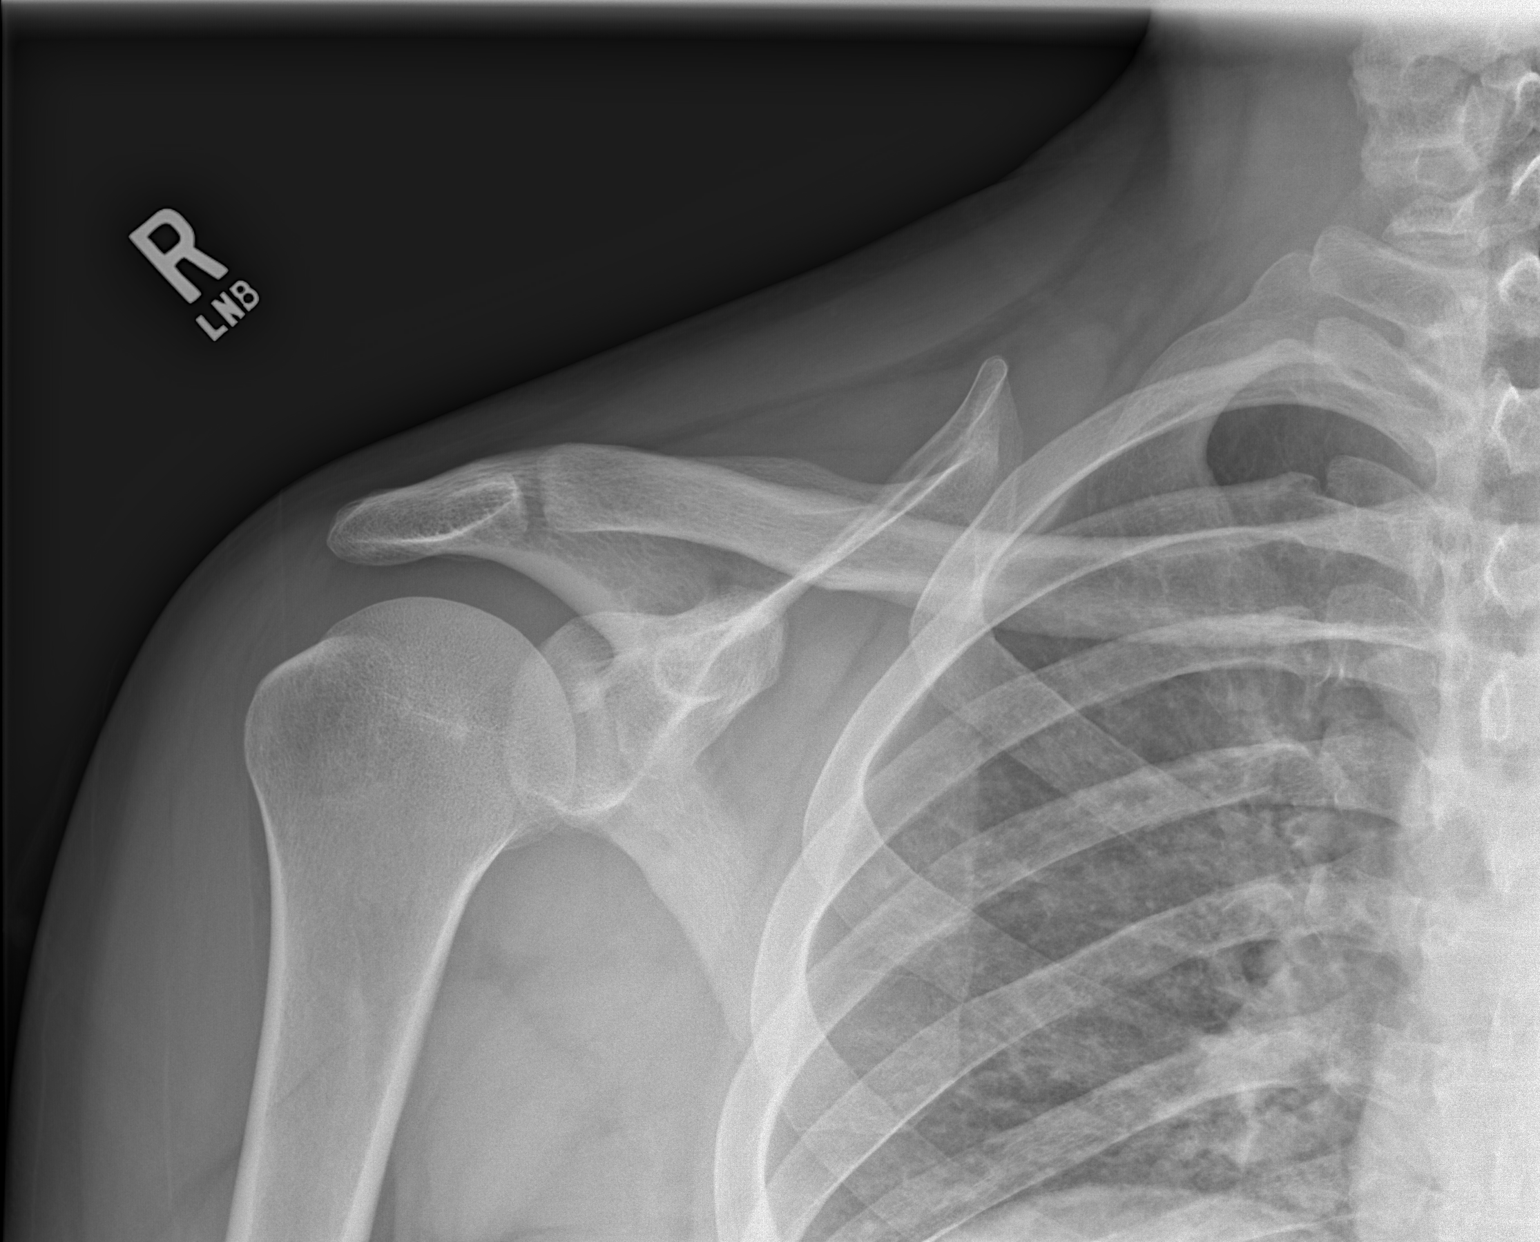
[im 3/3]
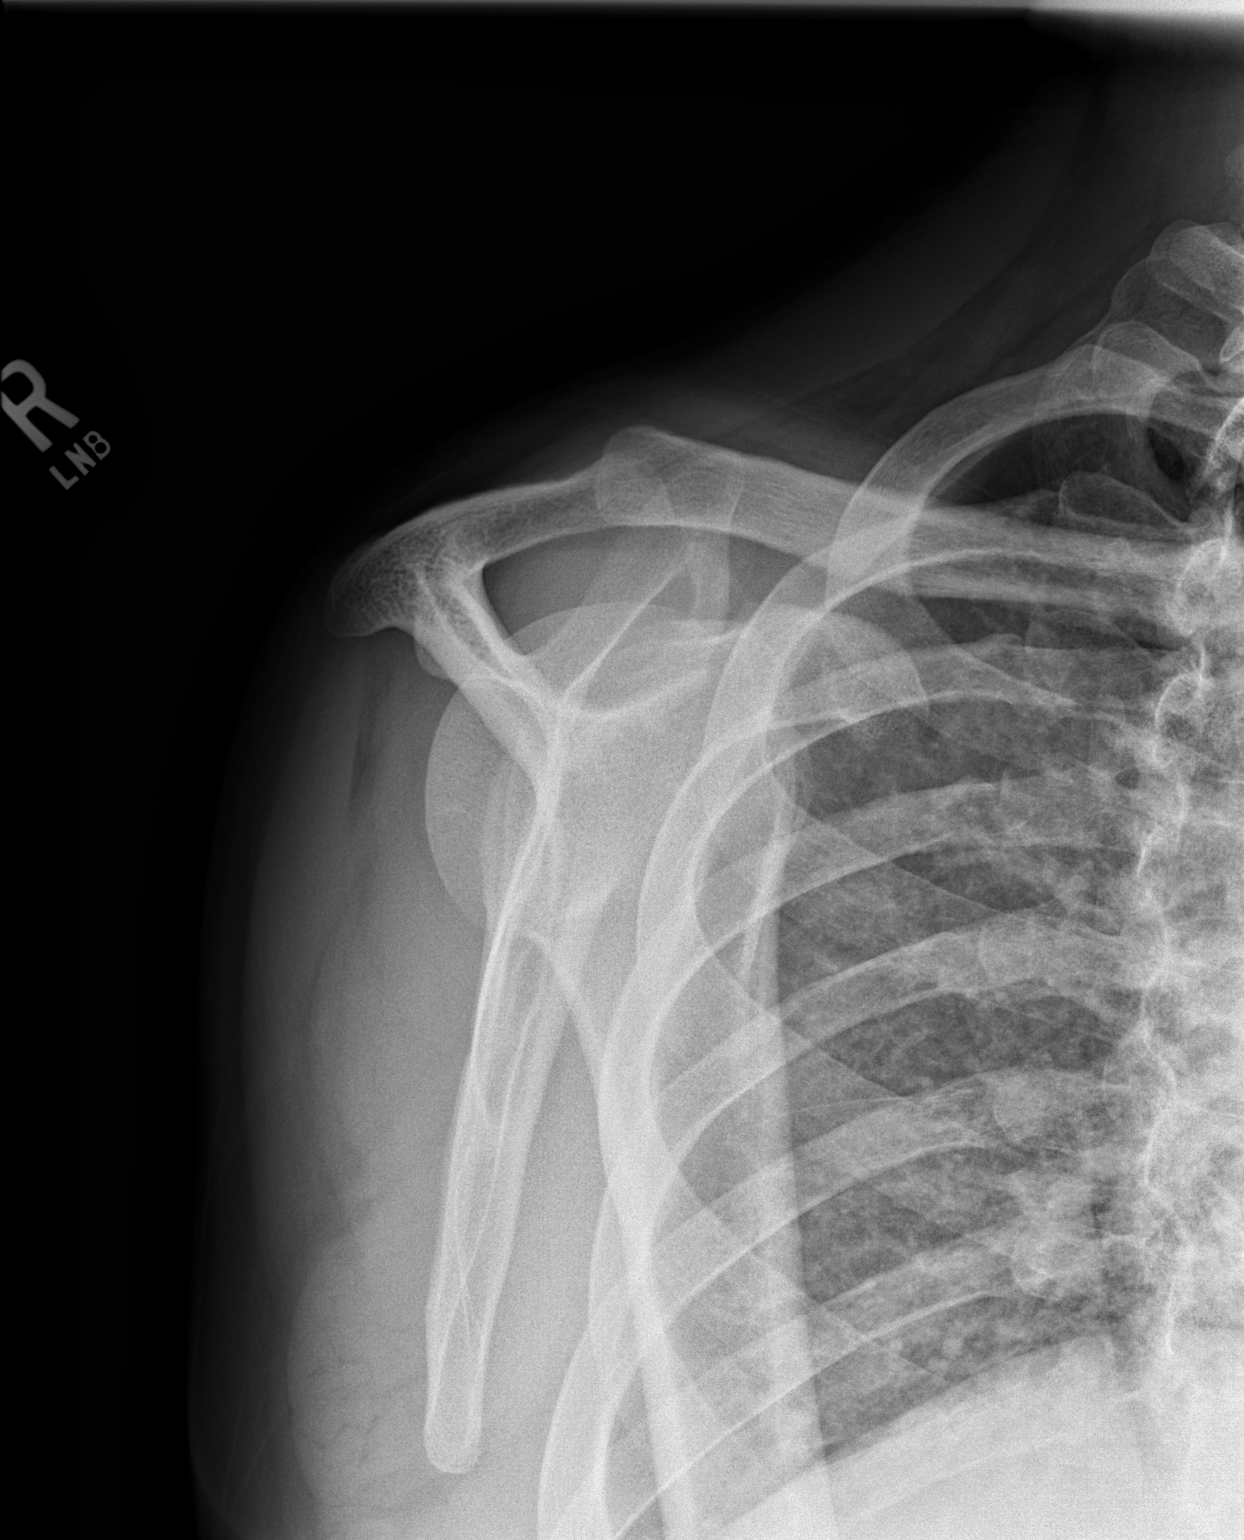

[3 of 3 positions shown; findings below may reference images not displayed]

FINDINGS: 3 views of the right shoulder demonstrates no fracture or dislocation. The
acromioclavicular joint is normal.
IMPRESSION: No acute osseous injury of the right shoulder.

[REDACTED]

## 2015-04-11 ENCOUNTER — Ambulatory Visit: Payer: BLUE CROSS/BLUE SHIELD | Admitting: Internal Medicine

## 2015-04-19 ENCOUNTER — Other Ambulatory Visit: Payer: Self-pay | Admitting: Internal Medicine

## 2015-11-22 ENCOUNTER — Other Ambulatory Visit: Payer: Self-pay | Admitting: Internal Medicine

## 2016-01-16 ENCOUNTER — Encounter: Payer: Self-pay | Admitting: Internal Medicine

## 2016-01-16 ENCOUNTER — Ambulatory Visit (INDEPENDENT_AMBULATORY_CARE_PROVIDER_SITE_OTHER): Payer: BLUE CROSS/BLUE SHIELD | Admitting: Internal Medicine

## 2016-01-16 VITALS — BP 128/92 | HR 74 | Temp 97.8°F | Ht 72.0 in | Wt 228.5 lb

## 2016-01-16 DIAGNOSIS — F1010 Alcohol abuse, uncomplicated: Secondary | ICD-10-CM | POA: Diagnosis not present

## 2016-01-16 DIAGNOSIS — I10 Essential (primary) hypertension: Secondary | ICD-10-CM

## 2016-01-16 DIAGNOSIS — Z0000 Encounter for general adult medical examination without abnormal findings: Secondary | ICD-10-CM

## 2016-01-16 LAB — CBC
HEMATOCRIT: 45.5 % (ref 38.5–50.0)
Hemoglobin: 15.7 g/dL (ref 13.2–17.1)
MCH: 32.1 pg (ref 27.0–33.0)
MCHC: 34.5 g/dL (ref 32.0–36.0)
MCV: 93 fL (ref 80.0–100.0)
MPV: 9.6 fL (ref 7.5–12.5)
Platelets: 196 10*3/uL (ref 140–400)
RBC: 4.89 MIL/uL (ref 4.20–5.80)
RDW: 13.5 % (ref 11.0–15.0)
WBC: 7.9 10*3/uL (ref 3.8–10.8)

## 2016-01-16 LAB — COMPREHENSIVE METABOLIC PANEL
ALK PHOS: 62 U/L (ref 40–115)
ALT: 52 U/L — AB (ref 9–46)
AST: 37 U/L (ref 10–40)
Albumin: 5.1 g/dL (ref 3.6–5.1)
BUN: 14 mg/dL (ref 7–25)
CALCIUM: 9.6 mg/dL (ref 8.6–10.3)
CO2: 28 mmol/L (ref 20–31)
Chloride: 101 mmol/L (ref 98–110)
Creat: 0.89 mg/dL (ref 0.60–1.35)
GLUCOSE: 89 mg/dL (ref 65–99)
POTASSIUM: 3.9 mmol/L (ref 3.5–5.3)
Sodium: 141 mmol/L (ref 135–146)
TOTAL PROTEIN: 7.7 g/dL (ref 6.1–8.1)
Total Bilirubin: 1.1 mg/dL (ref 0.2–1.2)

## 2016-01-16 LAB — LIPID PANEL
CHOL/HDL RATIO: 2.3 ratio (ref <=5.0)
CHOLESTEROL: 169 mg/dL (ref 125–200)
HDL: 74 mg/dL (ref >=40)
LDL Cholesterol: 69 mg/dL (ref <130)
Triglycerides: 128 mg/dL (ref <150)
VLDL: 26 mg/dL (ref <30)

## 2016-01-16 MED ORDER — LISINOPRIL-HYDROCHLOROTHIAZIDE 10-12.5 MG PO TABS: 1.0000 | ORAL_TABLET | Freq: Every day | ORAL | Status: DC

## 2016-01-16 NOTE — Patient Instructions (Signed)

## 2016-01-16 NOTE — Progress Notes (Signed)
Pre visit review using our clinic review tool, if applicable. No additional management support is needed unless otherwise documented below in the visit note. 

## 2016-01-16 NOTE — Progress Notes (Signed)
Subjective:    Patient ID: Donald GuessDustin Middleton, male    DOB: Oct 30, 1975, 40 y.o.   MRN: [id number]  HPI  Pt presents to the clinic today for his annual exam. He is also due to follow up HTN.  He is taking Lisinopril HCT at prescribed. He denies adverse side effects. His BP today is 128/92. ECG from 05/2014 reviewed.  Flu: never Tetanus: 2013 Dentist :as needed  Diet: He does eat meat. He consumes veggies daily, fruits a few days per week. He does eat some fried foods. He drinks water, soda during the day. Beer at night- 12-18 pack. Exercise: None  Review of Systems  Past Medical History  Diagnosis Date  . Hypertension   . Thyroid disease     Current Outpatient Prescriptions  Medication Sig Dispense Refill  . lisinopril-hydrochlorothiazide (PRINZIDE,ZESTORETIC) 10-12.5 MG tablet Take 1 tablet by mouth daily. MUST SCHEDULE ANNUAL PHYSICAL FOR MORE REFILLS (Patient taking differently: Take 1 tablet by mouth daily. ) 30 tablet 1   No current facility-administered medications for this visit.    No Known Allergies  Family History  Problem Relation Age of Onset  . Thyroid disease Mother   . Heart disease Father   . Heart attack Father   . Hypertension Father   . Diabetes Father   . Hyperlipidemia Neg Hx   . Cancer Neg Hx   . Stroke Paternal Grandmother   . Stroke Paternal Grandfather     Social History   Social History  . Marital Status: Married    Spouse Name: N/A  . Number of Children: N/A  . Years of Education: N/A   Occupational History  . Not on file.   Social History Main Topics  . Smoking status: Current Every Day Smoker -- 1.00 packs/day for 15 years    Types: Cigarettes  . Smokeless tobacco: Never Used  . Alcohol Use: 0.0 oz/week    0 Standard drinks or equivalent per week     Comment: 6 pack a day  . Drug Use: No     Comment: "not lately"  . Sexual Activity:    Partners: Male   Other Topics Concern  . Not on file   Social History Narrative      Constitutional: Denies fever, malaise, fatigue, headache or abrupt weight changes.  HEENT: Denies eye pain, eye redness, ear pain, ringing in the ears, wax buildup, runny nose, nasal congestion, bloody nose, or sore throat. Respiratory: Denies difficulty breathing, shortness of breath, cough or sputum production.   Cardiovascular: Denies chest pain, chest tightness, palpitations or swelling in the hands or feet.  Gastrointestinal: Pt reports intermittent reflux. Denies abdominal pain, bloating, constipation, diarrhea or blood in the stool.  GU: Denies urgency, frequency, pain with urination, burning sensation, blood in urine, odor or discharge. Musculoskeletal: Denies decrease in range of motion, difficulty with gait, muscle pain or joint pain and swelling.  Skin: Denies redness, rashes, lesions or ulcercations.  Neurological: Denies dizziness, difficulty with memory, difficulty with speech or problems with balance and coordination.  Psych: Denies anxiety, depression, SI/HI.  No other specific complaints in a complete review of systems (except as listed in HPI above).     Objective:   Physical Exam   BP 128/92 mmHg  Pulse 74  Temp(Src) 97.8 F (36.6 C) (Oral)  Ht 6' (1.829 m)  Wt 228 lb 8 oz (103.647 kg)  BMI 30.98 kg/m2  SpO2 98% Wt Readings from Last 3 Encounters:  01/16/16 228 lb 8 oz (  103.647 kg)  10/11/14 222 lb (100.699 kg)  08/20/14 219 lb (99.338 kg)    General: Appears his stated age, well developed, well nourished in NAD. Skin: Warm, dry and intact. No rashes, lesions or ulcerations noted. HEENT: Head: normal shape and size; Eyes: sclera white, no icterus, conjunctiva pink, PERRLA and EOMs intact; Ears: Tm's gray and intact, normal light reflex; Throat/Mouth: Teeth present, mucosa pink and moist, no exudate, lesions or ulcerations noted.  Neck:  Neck supple, trachea midline. No masses, lumps or thyromegaly present.  Cardiovascular: Normal rate and rhythm. S1,S2  noted.  No murmur, rubs or gallops noted. No JVD or BLE edema.  Pulmonary/Chest: Normal effort and positive vesicular breath sounds. No respiratory distress. No wheezes, rales or ronchi noted.  Abdomen: Soft and nontender. Normal bowel sounds. No distention or masses noted. Liver, spleen and kidneys non palpable. Musculoskeletal: Normal range of motion. No signs of joint swelling. No difficulty with gait.  Neurological: Alert and oriented. Cranial nerves II-XII grossly intact. Coordination normal.  Psychiatric: Mood and affect normal. Behavior is normal. Judgment and thought content normal.     BMET    Component Value Date/Time   NA 138 10/11/2014 1410   NA 140 06/24/2014 1958   K 3.6 10/11/2014 1410   K 3.5 06/24/2014 1958   CL 101 10/11/2014 1410   CL 103 06/24/2014 1958   CO2 31 10/11/2014 1410   CO2 26 06/24/2014 1958   GLUCOSE 139* 10/11/2014 1410   GLUCOSE 111* 06/24/2014 1958   BUN 15 10/11/2014 1410   BUN 9 06/24/2014 1958   CREATININE 0.82 10/11/2014 1410   CREATININE 0.85 06/24/2014 1958   CALCIUM 9.7 10/11/2014 1410   CALCIUM 8.9 06/24/2014 1958   GFRNONAA >60 06/24/2014 1958   GFRAA >60 06/24/2014 1958    Lipid Panel     Component Value Date/Time   CHOL 174 08/20/2014 1533   TRIG 239.0* 08/20/2014 1533   HDL 58.90 08/20/2014 1533   CHOLHDL 3 08/20/2014 1533   VLDL 47.8* 08/20/2014 1533    CBC    Component Value Date/Time   WBC 8.9 08/20/2014 1533   WBC 7.8 06/24/2014 1958   RBC 5.12 08/20/2014 1533   RBC 5.11 06/24/2014 1958   HGB 16.5 08/20/2014 1533   HGB 16.6 06/24/2014 1958   HCT 48.1 08/20/2014 1533   HCT 49.4 06/24/2014 1958   PLT 207.0 08/20/2014 1533   PLT 173 06/24/2014 1958   MCV 93.9 08/20/2014 1533   MCV 97 06/24/2014 1958   MCH 32.5 06/24/2014 1958   MCHC 34.2 08/20/2014 1533   MCHC 33.6 06/24/2014 1958   RDW 12.6 08/20/2014 1533   RDW 13.3 06/24/2014 1958    Hgb A1C No results found for: HGBA1C      Assessment & Plan:    Preventative Health Maintenance:  He declines flu shot Tetanus UTD Encouraged him to consume a balanced diet and start an exercise regimen Encouraged him to see a dentist at least annually Will check CBC, CMET, Lipid and HIV today  RTC in 1 year, sooner if needed

## 2016-01-16 NOTE — Assessment & Plan Note (Signed)
He is not interested in counseling or treatment at this time

## 2016-01-16 NOTE — Assessment & Plan Note (Signed)
Controlled on Lisinopril HCT Refilled x 1 year

## 2016-01-17 LAB — HIV ANTIBODY (ROUTINE TESTING W REFLEX): HIV 1&2 Ab, 4th Generation: NONREACTIVE

## 2016-10-01 ENCOUNTER — Telehealth: Payer: Self-pay | Admitting: Internal Medicine

## 2016-10-01 NOTE — Telephone Encounter (Signed)
Pt dropped off [employer] form to be filled out so pt can donate plasma. Pt is requesting completed form to be faxed [phone]. I placed in Rx tower.

## 2016-10-04 DIAGNOSIS — Z7689 Persons encountering health services in other specified circumstances: Secondary | ICD-10-CM

## 2016-10-04 NOTE — Telephone Encounter (Signed)
Placed in your box 

## 2016-10-04 NOTE — Telephone Encounter (Signed)
Done, given to MYD 

## 2016-10-04 NOTE — Telephone Encounter (Signed)
[facility]

## 2017-01-20 ENCOUNTER — Other Ambulatory Visit: Payer: Self-pay | Admitting: Internal Medicine

## 2017-01-20 DIAGNOSIS — I10 Essential (primary) hypertension: Secondary | ICD-10-CM

## 2019-12-13 ENCOUNTER — Ambulatory Visit: Payer: BLUE CROSS/BLUE SHIELD | Attending: Internal Medicine

## 2019-12-13 DIAGNOSIS — Z23 Encounter for immunization: Secondary | ICD-10-CM

## 2019-12-13 NOTE — Progress Notes (Signed)
   Covid-19 Vaccination Clinic  Name:  Murle Otting    MRN: [id number] DOB: June 28, 1976  12/13/2019  Mr. Coachman was observed post Covid-19 immunization for 15 minutes without incident. He was provided with Vaccine Information Sheet and instruction to access the V-Safe system.   Mr. Cruces was instructed to call 911 with any severe reactions post vaccine: Marland Kitchen Difficulty breathing  . Swelling of face and throat  . A fast heartbeat  . A bad rash all over body  . Dizziness and weakness   Immunizations Administered    Name Date Dose VIS Date Route   Pfizer COVID-19 Vaccine 12/13/2019  4:32 PM 0.3 mL 08/10/2019 Intramuscular   Manufacturer: [employer], [employer]   Lot: [id number]   NDC: [address]-5

## 2020-01-07 ENCOUNTER — Ambulatory Visit: Payer: BLUE CROSS/BLUE SHIELD | Attending: Internal Medicine

## 2020-01-07 DIAGNOSIS — Z23 Encounter for immunization: Secondary | ICD-10-CM

## 2020-01-07 NOTE — Progress Notes (Signed)
   Covid-19 Vaccination Clinic  Name:  Donald Middleton    MRN: [id number] DOB: 1976-07-06  01/07/2020  [name] was observed post Covid-19 immunization for 15 minutes without incident. He was provided with Vaccine Information Sheet and instruction to access the V-Safe system.   [name] was instructed to call 911 with any severe reactions post vaccine: Marland Kitchen Difficulty breathing  . Swelling of face and throat  . A fast heartbeat  . A bad rash all over body  . Dizziness and weakness   Immunizations Administered    Name Date Dose VIS Date Route   Pfizer COVID-19 Vaccine 01/07/2020  4:14 PM 0.3 mL 10/24/2018 Intramuscular   Manufacturer: [employer], [employer]   Lot: [id number]   NDC: [address]-1
